# Patient Record
Sex: Female | Born: 2000 | Race: Black or African American | Hispanic: No | Marital: Single | Smoking: Never smoker
Health system: Southern US, Community
[De-identification: ages and names within clinical notes are randomized; demographics above are authoritative.]

## PROBLEM LIST (undated history)

## (undated) DIAGNOSIS — D649 Anemia, unspecified: Secondary | ICD-10-CM

---
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
## 2020-06-16 ENCOUNTER — Emergency Department (HOSPITAL_COMMUNITY): Payer: Medicaid - Out of State

## 2020-06-16 ENCOUNTER — Other Ambulatory Visit: Payer: Self-pay

## 2020-06-16 ENCOUNTER — Encounter (HOSPITAL_COMMUNITY): Payer: Self-pay | Admitting: *Deleted

## 2020-06-16 ENCOUNTER — Emergency Department (HOSPITAL_COMMUNITY)
Admission: EM | Admit: 2020-06-16 | Discharge: 2020-06-16 | Disposition: A | Discharge status: Home / Self Care | Payer: Medicaid - Out of State | Attending: Emergency Medicine | Admitting: Emergency Medicine

## 2020-06-16 DIAGNOSIS — S93412A Sprain of calcaneofibular ligament of left ankle, initial encounter: Secondary | ICD-10-CM

## 2020-06-16 DIAGNOSIS — S99912A Unspecified injury of left ankle, initial encounter: Secondary | ICD-10-CM | POA: Diagnosis present

## 2020-06-16 DIAGNOSIS — S93402A Sprain of unspecified ligament of left ankle, initial encounter: Secondary | ICD-10-CM | POA: Diagnosis not present

## 2020-06-16 DIAGNOSIS — X509XXA Other and unspecified overexertion or strenuous movements or postures, initial encounter: Secondary | ICD-10-CM | POA: Diagnosis not present

## 2020-06-16 DIAGNOSIS — Y9302 Activity, running: Secondary | ICD-10-CM | POA: Insufficient documentation

## 2020-06-16 HISTORY — DX: Anemia, unspecified: D64.9

## 2020-06-16 MED ORDER — IBUPROFEN 800 MG PO TABS
800.0000 mg | ORAL_TABLET | Freq: Once | ORAL | Status: AC
  Administered 2020-06-16: 800 mg via ORAL
  Filled 2020-06-16: qty 1

## 2020-06-16 NOTE — Discharge Instructions (Addendum)
You were evaluated today for your ankle injury.  Your vital signs and physical exam are very reassuring.  Your x-ray was negative for acute fracture or dislocation. I suspect your symptoms are secondary to ankle sprain, which is injury of the ligaments of the joint.  Below is the contact information for Dr. Dion Saucier, orthopedic physician- you may follow-up with him for reevaluation in 1 to 2 weeks.   Recommend you rest the joint, ice it for 15 to 20 minutes at a time daily for the next several days, and elevate the joint whenever you are at rest.   May utilize Tylenol or ibuprofen as needed at home for your discomfort.  Return to the emergency department if you develop any numbness, tingling, weakness in your leg or foot, or if you develop any other new severe symptoms.

## 2020-06-16 NOTE — ED Triage Notes (Signed)
Per EMS, pt was running and tripped. She hurt her left ankle, lateral aspect. Swelling pressent. Tender. No deformity. Good PMS.

## 2020-06-16 NOTE — ED Provider Notes (Signed)
[facility] Provider Note   CSN: [id number] Arrival date & time: 06/16/20  1514     History Chief Complaint  Patient presents with  . Leg Pain    Villa Burgin is a 20 y.o. female who presents with concern for left ankle pain after she twisted her ankle while running this evening.  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 She reports eversion of her ankle when she lost her footing while running.  She states she had immediate left ankle pain with swelling after the injury. Has been unable to ambulate on it since the incident.  She denies any numbness, tingling, weakness in her lower extremity.  Has never injured this ankle before.  Personally reviewed the patient's medical records.  She is history of anemia, but does not carry any other diagnoses and is not on any medications every day.  She is here for school from [place].  HPI     Past Medical History:  Diagnosis Date  . Anemia     There are no problems to display for this patient.   History reviewed. No pertinent surgical history.   OB History   No obstetric history on file.     No family history on file.  Social History   Tobacco Use  . Smoking status: Never Smoker  . Smokeless tobacco: Never Used  Substance Use Topics  . Alcohol use: Not Currently    Home Medications Prior to Admission medications   Not on File    Allergies    Patient has no allergy information on record.  Review of Systems   Review of Systems  Constitutional: Negative.   HENT: Negative.   Respiratory: Negative.   Cardiovascular: Negative.   Gastrointestinal: Negative.   Musculoskeletal: Positive for joint swelling.       Left ankle  Skin: Negative.   Neurological: Negative.   Hematological: Negative.     Physical Exam Updated Vital Signs BP 115/72 (BP Location: Left Arm)   Pulse 62   Temp 98.9 F (37.2 C) (Oral)   Resp 18   LMP 06/16/2020   SpO2 100%   Physical Exam Vitals and nursing note  reviewed.  HENT:     Head: Normocephalic and atraumatic.     Mouth/Throat:     Mouth: Mucous membranes are moist.     Pharynx: No oropharyngeal exudate or posterior oropharyngeal erythema.  Eyes:     General:        Right eye: No discharge.        Left eye: No discharge.     Conjunctiva/sclera: Conjunctivae normal.     Pupils: Pupils are equal, round, and reactive to light.  Cardiovascular:     Rate and Rhythm: Normal rate and regular rhythm.     Pulses: Normal pulses.     Heart sounds: Normal heart sounds.  Pulmonary:     Effort: Pulmonary effort is normal. No respiratory distress.     Breath sounds: Normal breath sounds. No wheezing or rales.  Abdominal:     General: Bowel sounds are normal. There is no distension.     Palpations: Abdomen is soft.     Tenderness: There is no abdominal tenderness.  Musculoskeletal:        General: Signs of injury present. No deformity.     Cervical back: Neck supple.     Right hip: Normal.     Left hip: Normal.     Right upper leg: Normal.     Left upper  leg: Normal.     Right knee: Normal.     Left knee: Normal.     Right lower leg: Normal. No edema.     Left lower leg: Normal. No edema.     Right ankle: Normal.     Right Achilles Tendon: Normal.     Left ankle: Swelling present. No ecchymosis or lacerations. Tenderness present over the lateral malleolus, ATF ligament and CF ligament. Anterior drawer test negative. Normal pulse.     Left Achilles Tendon: Normal.     Right foot: Normal. Normal capillary refill. Normal pulse.     Left foot: Normal. Normal capillary refill. Normal pulse.       Legs:     Comments: Decreased ROM in left ankle due to swelling and pain.  Skin:    General: Skin is warm and dry.     Capillary Refill: Capillary refill takes less than 2 seconds.  Neurological:     General: No focal deficit present.     Mental Status: She is alert and oriented to person, place, and time.     Sensory: Sensation is intact.      Motor: Motor function is intact.  Psychiatric:        Mood and Affect: Mood normal.     ED Results / Procedures / Treatments   Labs (all labs ordered are listed, but only abnormal results are displayed) Labs Reviewed - No data to display  EKG None  Radiology DG Ankle Complete Left  Result Date: 06/16/2020 CLINICAL DATA:  Left ankle pain after injury today. EXAM: LEFT ANKLE COMPLETE - 3+ VIEW COMPARISON:  None. FINDINGS: There is no evidence of fracture, dislocation, or joint effusion. There is no evidence of arthropathy or other focal bone abnormality. Soft tissue swelling is noted over the lateral malleolus. IMPRESSION: No fracture or dislocation is noted. Soft tissue swelling is noted over lateral malleolus. Electronically Signed   By: Lupita Raider M.D.   On: 06/16/2020 16:18    Procedures Procedures (including critical care time)  Medications Ordered in ED Medications  ibuprofen (ADVIL) tablet 800 mg (has no administration in time range)    ED Course  I have reviewed the triage vital signs and the nursing notes.  Pertinent labs & imaging results that were available during my care of the patient were reviewed by me and considered in my medical decision making (see chart for details).    MDM Rules/Calculators/A&P                         20 year old female presents with concern for left ankle injury after twisting it while running this evening.   Vital signs are normal and intact.  Physical exam significant for left ankle swelling over the lateral malleolus with associated tenderness to palpation over the lateral malleolus, anterior tibiofibular ligament, calcaneofibular ligament. Patient is neurovascularly intact in BLE.   Plain film of the left ankle obtained in triage with soft tissue swelling over the lateral malleolus, without fracture or dislocation. Suspect ankle sprain at this time.  Ibuprofen offered, will apply Ace wrap and give crutches.  Will provide contact  information for orthopedist for follow-up, given the patient is an athlete and very concerned about recovery of her ankle.  Given reassuring physical exam and imaging studies, no further work-up is warranted in the emergency department at this time.  Recommend RICE therapy, anti-inflammatories, and orthopedic follow-up.  Saleha voiced understanding of her medical evaluation and  treatment plan.  Each of her questions were answered to her expressed satisfaction.  Return precautions given.  Patient is well-appearing, stable, appropriate for discharge at this time.  This chart was dictated using voice recognition software, Dragon. Despite the best efforts of this provider to proofread and correct errors, errors may still occur which can change documentation meaning.  Final Clinical Impression(s) / ED Diagnoses Final diagnoses:  None    Rx / DC Orders ED Discharge Orders    None       Sherrilee Gilles 06/16/20 1738    Linwood Dibbles, MD 06/17/20 [phone]

## 2020-06-16 NOTE — Progress Notes (Signed)
Orthopedic Tech Progress Note Patient Details:  Sandra Edwards [birth year] [id number]  Ortho Devices Type of Ortho Device: Ace wrap Ortho Device/Splint Location: applied ace wrap and crutches Ortho Device/Splint Interventions: Ordered,Application,Adjustment   Post Interventions Patient Tolerated: Well Instructions Provided: Care of device   Jennye Moccasin 06/16/2020, 5:44 PM

## 2021-10-04 ENCOUNTER — Other Ambulatory Visit: Payer: Self-pay

## 2021-10-04 ENCOUNTER — Emergency Department (HOSPITAL_BASED_OUTPATIENT_CLINIC_OR_DEPARTMENT_OTHER)
Admission: EM | Admit: 2021-10-04 | Discharge: 2021-10-04 | Disposition: A | Discharge status: Home / Self Care | Payer: Medicaid Other | Attending: Emergency Medicine | Admitting: Emergency Medicine

## 2021-10-04 ENCOUNTER — Encounter (HOSPITAL_BASED_OUTPATIENT_CLINIC_OR_DEPARTMENT_OTHER): Payer: Self-pay | Admitting: Emergency Medicine

## 2021-10-04 ENCOUNTER — Ambulatory Visit
Admission: EM | Admit: 2021-10-04 | Discharge: 2021-10-04 | Disposition: A | Discharge status: Home / Self Care | Payer: Medicaid - Out of State

## 2021-10-04 DIAGNOSIS — K089 Disorder of teeth and supporting structures, unspecified: Secondary | ICD-10-CM | POA: Diagnosis not present

## 2021-10-04 DIAGNOSIS — R6884 Jaw pain: Secondary | ICD-10-CM | POA: Diagnosis present
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
## 2021-10-04 NOTE — Discharge Instructions (Signed)
As we discussed, your work-up in the ER today is reassuring for acute abnormalities.  I have an extremely low suspicion that you have a fracture in your face and therefore have advised against getting imaging at this time.  I recommend rest and ice with Tylenol/ibuprofen as needed for pain. ? ?Return if development of any new or worsening symptoms. ?
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
## 2021-10-04 NOTE — ED Notes (Signed)
Patient is being discharged from the Urgent Care and sent to the Emergency Department via POA. Per L. Morgan-Scales PA-C , patient is in need of higher level of care due to need for further evaluation. Patient is aware and verbalizes understanding of plan of care.  ?Vitals:  ? 10/04/21 1013  ?BP: 124/76  ?Pulse: 60  ?Resp: 18  ?Temp: 98 ?F (36.7 ?C)  ?SpO2: 98%  ?  ?
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
## 2022-02-01 IMAGING — CR DG ANKLE COMPLETE 3+V*L*
3 series · 3 of 3 positions shown · non-contrast
Comparison: None.

CLINICAL DATA: Left ankle pain after injury today.

EXAM:
LEFT ANKLE COMPLETE - 3+ VIEW

[x ankle ap left]
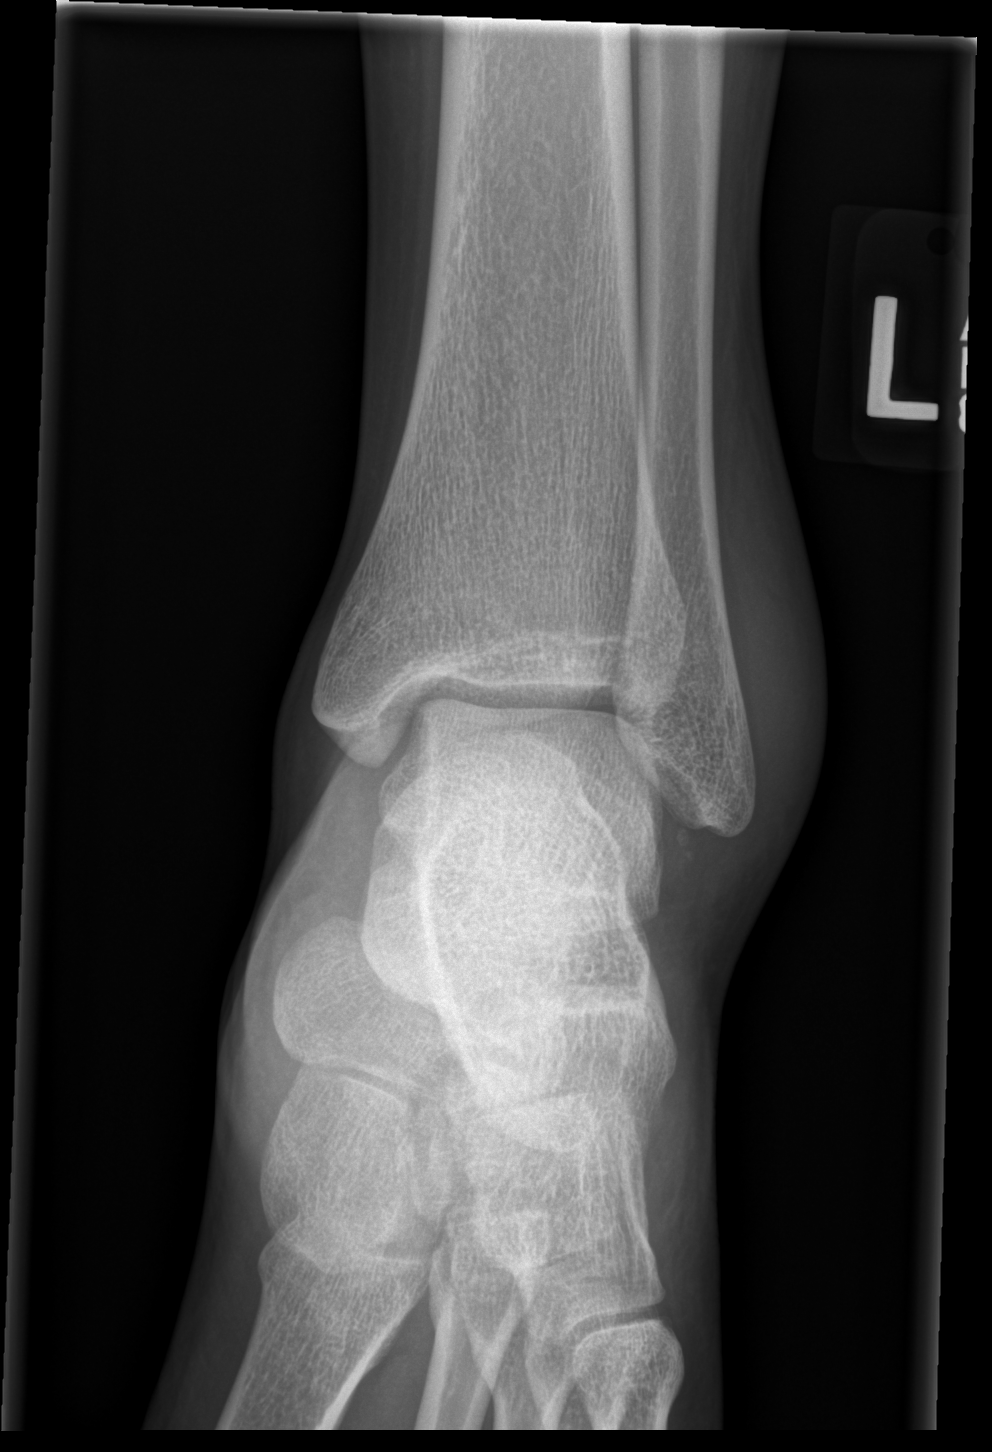

[x ankle obl left]
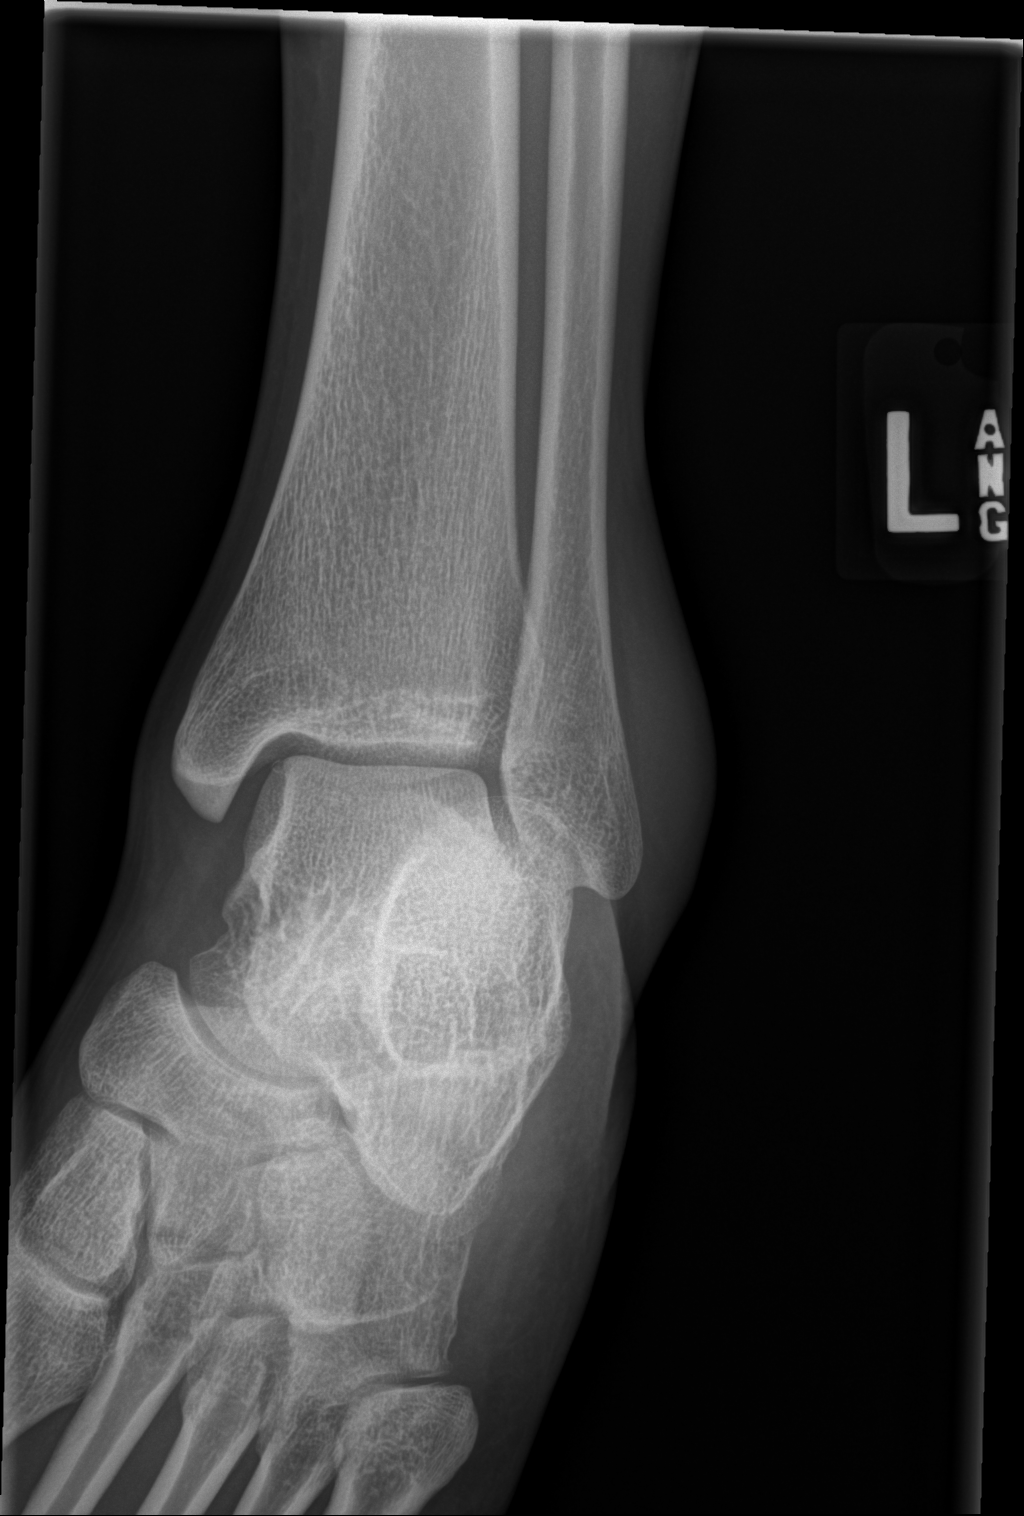

[x ankle lat left]
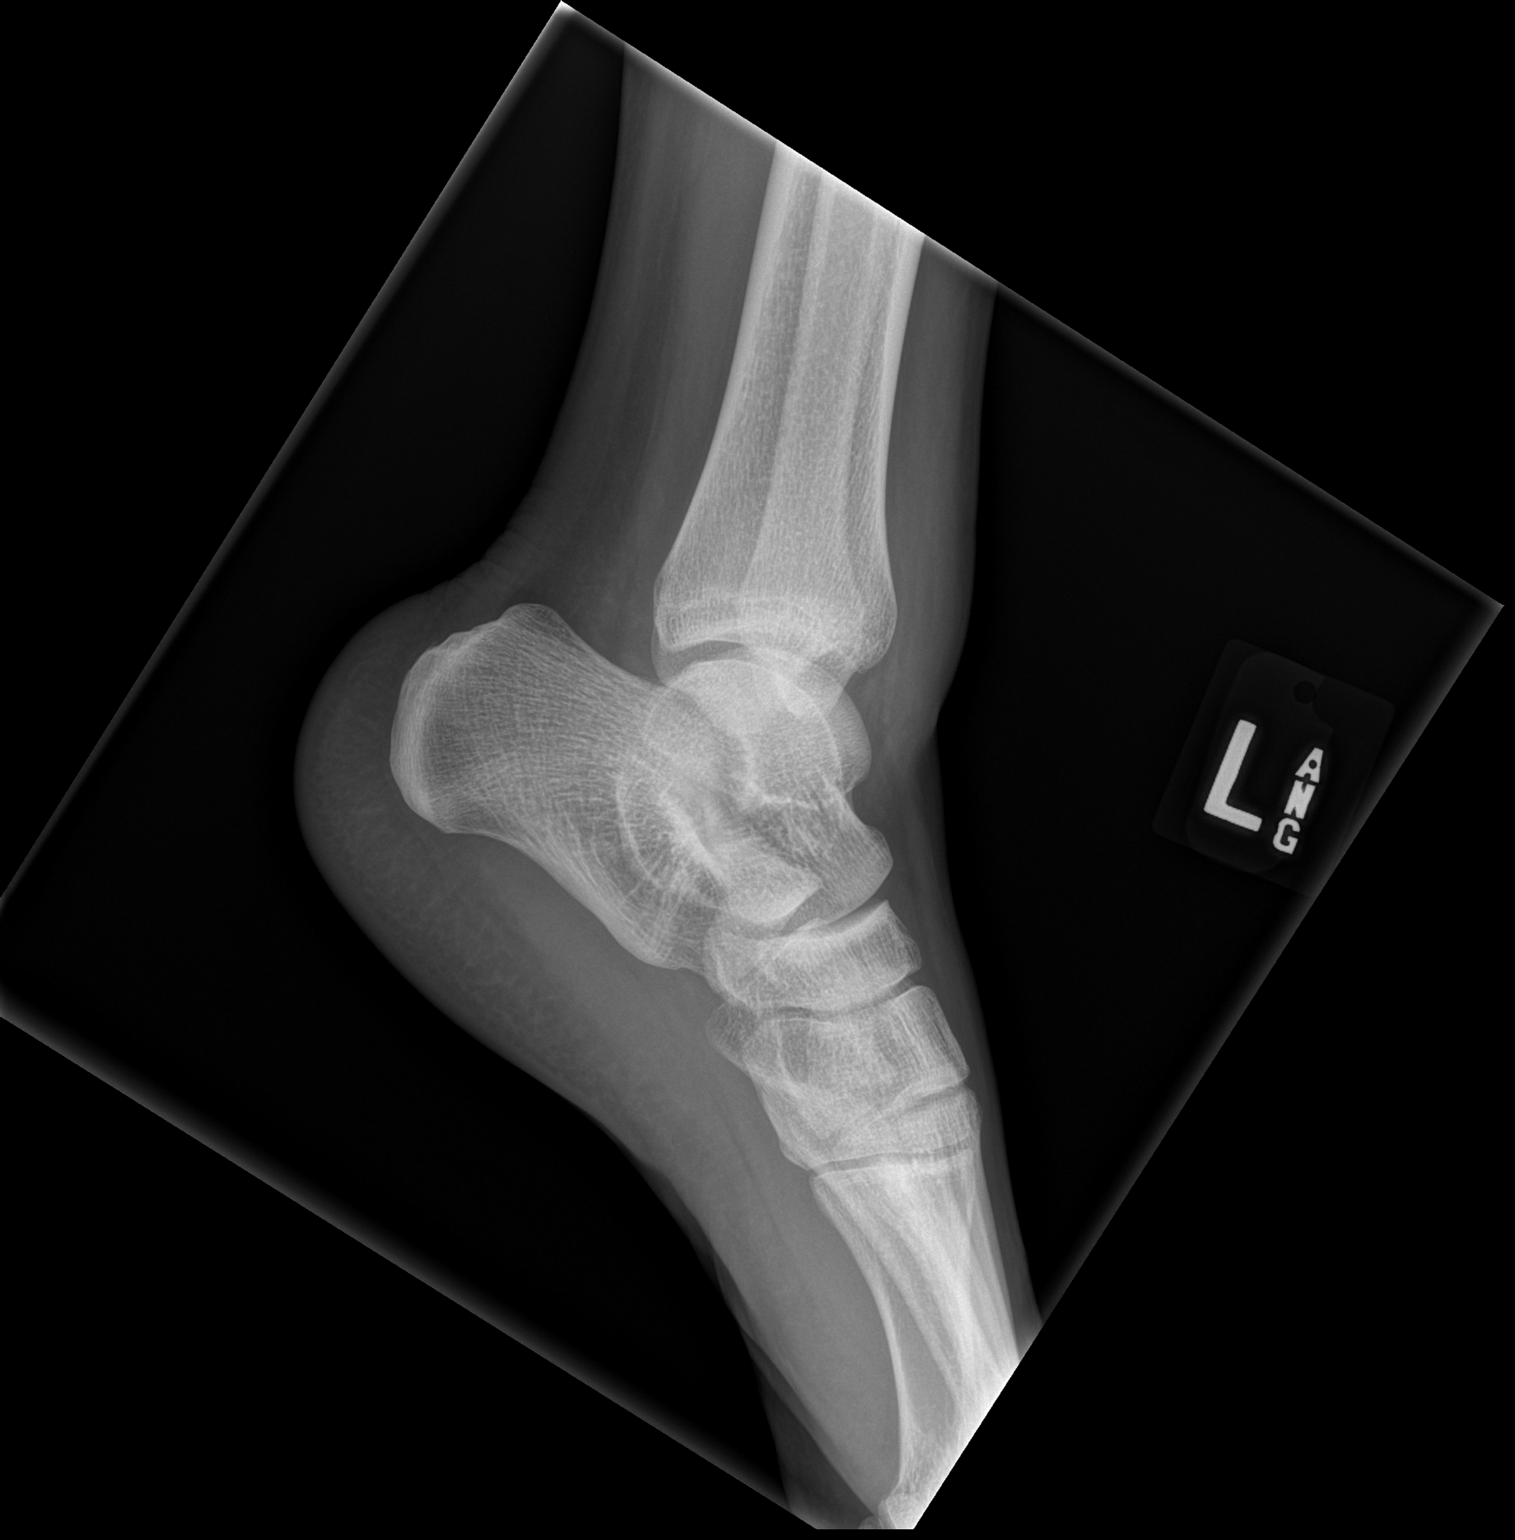

[3 of 3 positions shown; findings below may reference images not displayed]

FINDINGS: There is no evidence of fracture, dislocation, or joint effusion.
There is no evidence of arthropathy or other focal bone abnormality.
Soft tissue swelling is noted over the lateral malleolus.
IMPRESSION: No fracture or dislocation is noted. Soft tissue swelling is noted
over lateral malleolus.

## 2023-01-02 ENCOUNTER — Encounter (INDEPENDENT_AMBULATORY_CARE_PROVIDER_SITE_OTHER): Payer: Self-pay

## 2023-01-02 ENCOUNTER — Ambulatory Visit (INDEPENDENT_AMBULATORY_CARE_PROVIDER_SITE_OTHER): Payer: 59 | Admitting: Family Nurse Practitioner

## 2023-01-02 VITALS — BP 105/67 | HR 62 | Temp 97.9°F | Resp 17 | Ht 69.0 in | Wt 150.0 lb

## 2023-01-02 DIAGNOSIS — Z711 Person with feared health complaint in whom no diagnosis is made: Secondary | ICD-10-CM

## 2023-01-02 DIAGNOSIS — N926 Irregular menstruation, unspecified: Secondary | ICD-10-CM

## 2023-01-02 LAB — POCT PREGNANCY TEST, URINE HCG: POCT Pregnancy HCG Test, UR: NEGATIVE

## 2023-01-02 NOTE — Progress Notes (Unsigned)
Urgent Care  Note    Patient: Jade Rogers   Date: 01/03/2023   MRN: [id number]       Subjective     Chief Complaint   Patient presents with    Menstrual Problem     Pt c/o late period wants to do preg test. No otc meds 4 days late        HPI:  HPI    Jade Rogers is a 22 y.o. female presents with amenorrhea.  Patient is 4 days late and would like to do pregnancy test.  Reports periods are usually regular.  Denies any abdominal pain, back pain, urinary symptoms, vaginal discharge  No known STD exposures but would like to complete STI panel  No previous STDs  No contraceptives or daily meds      Pertinent Past Medical, Surgical, Family and Social History were reviewed.    Current Medications[1]    Allergies[2]    Medications and Allergies reviewed.         Objective     Vitals:    01/02/23 1652   BP: 105/67   Pulse: 62   Resp: 17   Temp: 97.9 F (36.6 C)   SpO2: 99%       Physical Exam     General: no acute distress, well developed, well nourished.    Lung: no respiratory distress, clear to auscultation bilaterally, no rales, no wheeze, no rhonchi    Heart: regular rate and rhythm, no murmurs    Abdomen: soft, non-distended, nontender, normoactive bowel sounds, no rebound, no guarding    GU: Deferred    Neurologic: alert, oriented, speech clear    UCC COURSE  LABS  The following POCT tests were ordered, reviewed and discussed with the patient/family.     Results       Procedure Component Value Units Date/Time    Extra Tubes [[id number]] Collected: 01/02/23 1721    Specimen: Blood, Venous Updated: 01/03/23 1000    Narrative:      The following orders were created for panel order Extra Tubes.  Procedure                               Abnormality         Status                     ---------                               -----------         ------                     Tresa Res - EDTA Hold JYNW[[id number]]                         Final result                 Please view results for these tests on the individual orders.     Lavender - EDTA Hold Tube [[id number]] Collected: 01/02/23 1721    Specimen: Blood, Venous Updated: 01/03/23 1000     Extra Tube Hold for add-ons.    Hepatitis C Antibody, Total (Order) [[id number]] Collected: 01/02/23 1721    Specimen: Blood, Venous Updated: 01/03/23 0201    Narrative:      The following  orders were created for panel order Hepatitis C Antibody, Total (Order).  Procedure                               Abnormality         Status                     ---------                               -----------         ------                     Hepatitis C Antibody, Tot.Marland KitchenMarland Kitchen[[id number]]  Normal              Final result               Gold SST Hold JYNW[[id number]]                                Final result                 Please view results for these tests on the individual orders.    HIV-1/2, Antigen and Antibody with Reflex to Confirmation (Order) [[id number]] Collected: 01/02/23 1721    Specimen: Blood, Venous Updated: 01/03/23 0201    Narrative:      The following orders were created for panel order HIV-1/2, Antigen and Antibody with Reflex to Confirmation (Order).  Procedure                               Abnormality         Status                     ---------                               -----------         ------                     HIV 1/2 Ag/Ab 4th Generat.Marland KitchenMarland Kitchen[[id number]]  Normal              Final result               Lavender - EDTA Hold LKGM[[id number]]                         Final result               Gold SST Hold GUYQ[[id number]]                                                             Please view results for these tests on the individual orders.    Erin Sons Hold Tube [[id number]] Collected: 01/02/23 1721    Specimen: Blood, Venous Updated: 01/03/23 0201     Extra Tube Hold for add-ons.    Lavender - EDTA Hold Tube [id number] Collected:  01/02/23 1721    Specimen: Blood, Venous Updated: 01/03/23 0201     Extra Tube Hold for add-ons.    Syphilis Screen, IgG and IgM [[id number]]  (Normal) Collected: 01/02/23 1721     Specimen: Blood, Venous Updated: 01/03/23 0146     Syphilis Screen IgG and IgM Non-Reactive    HIV 1/2 Ag/Ab 4th Generation with Reflex (Component) [[id number]]  (Normal) Collected: 01/02/23 1721    Specimen: Blood, Venous Updated: 01/03/23 0145     HIV Ag/Ab 4th Generation Non-Reactive    Hepatitis B (HBV) Surface Antigen with Reflex to Confirmation [[id number]]  (Normal) Collected: 01/02/23 1721    Specimen: Blood, Venous Updated: 01/03/23 0144     Hepatitis B Surface Antigen Non-Reactive    Hepatitis C Antibody, Total (Component) [[id number]]  (Normal) Collected: 01/02/23 1721    Specimen: Blood, Venous Updated: 01/03/23 0144     Hepatitis C Antibody Non-Reactive    Chlamydia trachomatis, Neisseria gonorrhoeae, Mycoplasma genitalium and Trichomonas vaginalis [[id number]] Collected: 01/02/23 1721    Specimen: Swab from Vagina  Updated: 01/02/23 1721    POCT Pregnancy Test, Urine HCG [[id number]]  (Normal) Collected: 01/02/23 1720     Updated: 01/02/23 1721     POCT QC Pass     POCT Pregnancy HCG Test, UR Negative     Comment: Negative Value is Normal in Healthy Males or Healthy non-pregnant Females            There were no x-rays reviewed with this patient during the visit.    Current Inpatient Medications with Last Dose Taken[3]          PROCEDURES:  Procedures      Plan:  Patient well-appearing and VSS  Pregnancy test negative in clinic.  Advised to repeat pregnancy test and a few days if still no period.    STI panel completed. Patient opted for self.  Advised to start MyChart.  Will be contacted with any positive results  Advised to follow-up with gyne   Patient agrees with plan      Assessment         Jade Rogers was seen today for menstrual problem.    Diagnoses and all orders for this visit:    Missed period  -     POCT Pregnancy Test, Urine HCG; Future  -     POCT Pregnancy Test, Urine HCG  -     Extra Tubes; Future  -     Extra Tubes    Concern about STD in female without diagnosis  -     Hepatitis B (HBV) Surface  Antigen with Reflex to Confirmation; Future  -     Hepatitis C Antibody, Total (Order); Future  -     HIV-1/2, Antigen and Antibody with Reflex to Confirmation (Order); Future  -     Syphilis Screen, IgG and IgM; Future  -     Hepatitis B (HBV) Surface Antigen with Reflex to Confirmation  -     Hepatitis C Antibody, Total (Order)  -     HIV-1/2, Antigen and Antibody with Reflex to Confirmation (Order)  -     Syphilis Screen, IgG and IgM  -     Chlamydia trachomatis, Neisseria gonorrhoeae, Mycoplasma genitalium and Trichomonas vaginalis; Future  -     Chlamydia trachomatis, Neisseria gonorrhoeae, Mycoplasma genitalium and Trichomonas vaginalis  -     Extra Tubes; Future  -     Extra Tubes        Plan and follow-up  discussed with patient. See AVS for further documentation.         [1] No current outpatient medications on file.  [2] No Known Allergies  [3]   No current facility-administered medications for this visit.

## 2023-01-02 NOTE — Patient Instructions (Signed)
If still no period, do first morning home pregnancy test in a few days  Follow up with gyne or PCP   We will contact you with any positive results

## 2023-01-03 ENCOUNTER — Encounter (INDEPENDENT_AMBULATORY_CARE_PROVIDER_SITE_OTHER): Payer: Self-pay | Admitting: Family Nurse Practitioner

## 2023-01-03 LAB — LAB USE ONLY - LAVENDER - EDTA HOLD TUBE

## 2023-01-04 ENCOUNTER — Telehealth (INDEPENDENT_AMBULATORY_CARE_PROVIDER_SITE_OTHER): Payer: Self-pay | Admitting: Family

## 2023-01-04 ENCOUNTER — Ambulatory Visit (INDEPENDENT_AMBULATORY_CARE_PROVIDER_SITE_OTHER): Payer: 59 | Admitting: Family Nurse Practitioner

## 2023-01-04 ENCOUNTER — Telehealth (INDEPENDENT_AMBULATORY_CARE_PROVIDER_SITE_OTHER): Payer: Self-pay | Admitting: Family Nurse Practitioner

## 2023-01-04 ENCOUNTER — Encounter (INDEPENDENT_AMBULATORY_CARE_PROVIDER_SITE_OTHER): Payer: Self-pay | Admitting: Family Nurse Practitioner

## 2023-01-04 DIAGNOSIS — A549 Gonococcal infection, unspecified: Secondary | ICD-10-CM

## 2023-01-04 DIAGNOSIS — N912 Amenorrhea, unspecified: Secondary | ICD-10-CM

## 2023-01-04 DIAGNOSIS — Z3202 Encounter for pregnancy test, result negative: Secondary | ICD-10-CM

## 2023-01-04 DIAGNOSIS — A749 Chlamydial infection, unspecified: Secondary | ICD-10-CM

## 2023-01-04 LAB — POCT PREGNANCY TEST, URINE HCG: POCT Pregnancy HCG Test, UR: NEGATIVE

## 2023-01-04 MED ORDER — AZITHROMYCIN 250 MG PO TABS
1000.0000 mg | ORAL_TABLET | Freq: Once | ORAL | 0 refills | Status: AC
Start: 2023-01-04 — End: 2023-01-04

## 2023-01-04 MED ORDER — CEFTRIAXONE SODIUM 500 MG IJ SOLR
500.0000 mg | Freq: Once | INTRAMUSCULAR | Status: AC
Start: 2023-01-04 — End: 2023-01-04
  Administered 2023-01-04: 500 mg via INTRAMUSCULAR

## 2023-01-04 NOTE — Telephone Encounter (Addendum)
Patient notified of positive gonorrhea and chlamydia.  Still no period, no new symptoms.  Advised to return to clinic for repeat pregnancy and Rocephin. Referral gynecologist.  Patient agrees with plan.

## 2023-01-04 NOTE — Addendum Note (Signed)
Addended by: Salem Senate B on: 01/04/2023 12:14 PM     Modules accepted: Orders

## 2023-01-04 NOTE — Progress Notes (Signed)
Rocephin IM injection only for gonorrhea, no adverse reaction.  Repeat pregnancy test negative.  Azithromycin for chlamydia sent to be cautious.  Repeat pregnancy test in a few days.

## 2023-01-04 NOTE — Telephone Encounter (Signed)
In error. Handled by on-site provider.

## 2023-09-09 ENCOUNTER — Encounter (HOSPITAL_COMMUNITY): Payer: Self-pay | Admitting: *Deleted

## 2023-09-09 ENCOUNTER — Emergency Department (HOSPITAL_COMMUNITY)
Admission: EM | Admit: 2023-09-09 | Discharge: 2023-09-09 | Discharge status: Against Medical Advice | Attending: Emergency Medicine | Admitting: Emergency Medicine

## 2023-09-09 ENCOUNTER — Other Ambulatory Visit: Payer: Self-pay

## 2023-09-09 DIAGNOSIS — L292 Pruritus vulvae: Secondary | ICD-10-CM | POA: Insufficient documentation

## 2023-09-09 DIAGNOSIS — Z5321 Procedure and treatment not carried out due to patient leaving prior to being seen by health care provider: Secondary | ICD-10-CM | POA: Diagnosis not present

## 2023-09-09 LAB — WET PREP, GENITAL
Clue Cells Wet Prep HPF POC: NONE SEEN
Sperm: NONE SEEN
Trich, Wet Prep: NONE SEEN
WBC, Wet Prep HPF POC: >=10 — AB (ref <10)
Yeast Wet Prep HPF POC: NONE SEEN

## 2023-09-09 LAB — URINALYSIS, ROUTINE W REFLEX MICROSCOPIC
Bilirubin Urine: NEGATIVE
Glucose, UA: NEGATIVE mg/dL
Hgb urine dipstick: NEGATIVE
Ketones, ur: NEGATIVE mg/dL
Nitrite: NEGATIVE
Protein, ur: NEGATIVE mg/dL
Specific Gravity, Urine: 1.019 (ref 1.005–1.030)
pH: 7 (ref 5.0–8.0)

## 2023-09-09 LAB — PREGNANCY, URINE: Preg Test, Ur: NEGATIVE

## 2023-09-09 NOTE — ED Provider Triage Note (Signed)
 Emergency Medicine Provider Triage Evaluation Note  Sandra Edwards , a 23 y.o. female  was evaluated in triage.  Pt complains of vaginal itching. She reports this has been ongoing for about 2-3 days. She is sexually active with one partner but reports condom use during these encounters. She states she is having a white chunky discharge with no appreciable odor. Denies dysuria, hematuria, or increased urinary frequency or urgency.  Review of Systems  Positive: As above Negative: As above  Physical Exam  LMP 08/26/2023 (Approximate)  Gen:   Awake, no distress   Resp:  Normal effort  MSK:   Moves extremities without difficulty  Other:  No abdominal tenderness  Medical Decision Making  Medically screening exam initiated at 6:04 PM.  Appropriate orders placed.  Krystiana Fornes was informed that the remainder of the evaluation will be completed by another provider, this initial triage assessment does not replace that evaluation, and the importance of remaining in the ED until their evaluation is complete.     Smitty Knudsen, PA-C 09/09/23 1806

## 2023-09-09 NOTE — ED Triage Notes (Signed)
 Pt is having vaginal itching and irritation as well as chunky white and chuncky discharge.

## 2023-09-09 NOTE — ED Notes (Addendum)
 Pt stated she was leaving the ED. Pt seen leaving the ED. Pt throwing things at staff.

## 2023-09-09 NOTE — ED Notes (Signed)
 Pt given swabs and instructions for self swab

## 2023-09-10 LAB — GC/CHLAMYDIA PROBE AMP (~~LOC~~) NOT AT ARMC
Chlamydia: POSITIVE — AB
Comment: NEGATIVE
Comment: NORMAL
Neisseria Gonorrhea: NEGATIVE

## 2023-09-11 ENCOUNTER — Telehealth (HOSPITAL_COMMUNITY): Payer: Self-pay

## 2023-09-11 MED ORDER — DOXYCYCLINE HYCLATE 100 MG PO CAPS: 100.0000 mg | ORAL_CAPSULE | Freq: Two times a day (BID) | ORAL | 0 refills | Status: AC

## 2023-10-28 ENCOUNTER — Encounter (INDEPENDENT_AMBULATORY_CARE_PROVIDER_SITE_OTHER): Payer: Self-pay

## 2023-10-28 ENCOUNTER — Ambulatory Visit (INDEPENDENT_AMBULATORY_CARE_PROVIDER_SITE_OTHER): Admitting: Family Nurse Practitioner

## 2023-10-28 VITALS — BP 141/88 | HR 66 | Temp 97.8°F | Resp 18 | Ht 70.0 in | Wt 166.8 lb

## 2023-10-28 DIAGNOSIS — B9789 Other viral agents as the cause of diseases classified elsewhere: Secondary | ICD-10-CM

## 2023-10-28 DIAGNOSIS — J329 Chronic sinusitis, unspecified: Secondary | ICD-10-CM

## 2023-10-28 LAB — POC COVID QUICKVUE ANTIGEN: QuickVue SARS COV2 Antigen POCT: NEGATIVE

## 2023-10-28 NOTE — Progress Notes (Signed)
 Urgent Care Provider Note    Patient: Jade Rogers   Date: 10/28/2023   MRN: [id number]       Subjective     Chief Complaint   Patient presents with    Otalgia     Pt C/O nose, ear headaches,  left eye pain. Onset this morning.       HPI:  HPI    Jade Rogers is a 23 y.o. female presents with URI sx that started this AM.  Reports left side nose and sinus pressure that is radiating to ear.  Starting to feel as if she is coming down with something, as mild body aches are starting. No recent COVID, Influenza, or antibiotics.  Took vitamin C   Denies fever, sore throat, cough    Pertinent Past Medical, Surgical, Family and Social History were reviewed.    Current Medications[1]    Allergies[2]    Medications and Allergies reviewed.         Objective     Vitals:    10/28/23 1343   BP: 141/88   Pulse: 66   Resp: 18   Temp: 97.8 F (36.6 C)   SpO2: 99%       Physical Exam     General: no acute distress, well developed, well nourished.    Eyes: no conjunctival injection or discharge    Ear: TMs clear bilaterally, normal light reflexes, clear canals without cerumen impaction    Nose: mild edema and tenderness to left maxilla     Throat: moist mucous membranes, no erythema, no exudates, no tonsillar swelling, no drooling, no PTA    Neck: supple, no cervical adenopathy    Lung: no respiratory distress, clear to auscultation bilaterally, no rales, no wheeze, no rhonchi    Heart: regular rate and rhythm, no murmurs    Neurologic: alert, oriented, speech clear    UCC COURSE  LABS  The following POCT tests were ordered, reviewed and discussed with the patient/family.     Results for orders placed or performed in visit on 10/28/23 (from the past 24 hours)   [employer] SARS-COV-2 Antigen POCT    Collection Time: 10/28/23  2:18 PM   Result Value    Quick[employer]ue SARS COV2 Antigen POCT Negative       There were no x-rays reviewed with this patient during the visit.    Current Inpatient Medications with Last Dose Taken[3]           PROCEDURES:  Procedures        MDM:  Ddx:  URI, COVID, Influenza, Strep, Uvulitis, PTA, Viral sinusitis, Bronchitis, Pneumonia   Patient well-appearing and VSS  COVID negative.  Discussed potential for false negative results    Low suspicion for bacterial process at this time   Supportive care and follow up discussed.  Start decongestant and Flonase.   Patient agrees with plan       Assessment         Jade Rogers was seen today for otalgia.    Diagnoses and all orders for this visit:    Viral sinusitis  -     QuickVue SARS-COV-2 Antigen POCT; Future  -     QuickVue SARS-COV-2 Antigen POCT        Plan and follow-up discussed with patient. See AVS for further documentation.         [1] No current outpatient medications on file.  [2] No Known Allergies  [3]   No current facility-administered medications for this  visit.

## 2023-10-28 NOTE — Patient Instructions (Signed)
 Take a decongestant   Flonase 1-2 sprays in each nostril for at least 1-2 weeks  Tylenol  or Motrin as needed   Follow up if symptoms are persistent or worsening after 7-10 days

## 2023-12-15 ENCOUNTER — Ambulatory Visit (INDEPENDENT_AMBULATORY_CARE_PROVIDER_SITE_OTHER): Admitting: Family Nurse Practitioner

## 2023-12-15 ENCOUNTER — Encounter (INDEPENDENT_AMBULATORY_CARE_PROVIDER_SITE_OTHER): Payer: Self-pay | Admitting: Family Nurse Practitioner

## 2023-12-15 VITALS — BP 100/65 | HR 68 | Temp 98.0°F | Resp 19 | Ht 70.0 in | Wt 161.0 lb

## 2023-12-15 DIAGNOSIS — Z111 Encounter for screening for respiratory tuberculosis: Secondary | ICD-10-CM

## 2023-12-15 NOTE — Progress Notes (Signed)
 [facility] TB NOTE:    Tuberculosis (TB) Screening and Risk Assessment completed today: Yes     Screening for Active disease/symptoms of Tuberculosis infection: Yes               Testing: Is not indicated based on screening assessment.

## 2024-01-12 ENCOUNTER — Ambulatory Visit (INDEPENDENT_AMBULATORY_CARE_PROVIDER_SITE_OTHER): Admitting: Physician Assistant

## 2024-01-12 ENCOUNTER — Encounter (INDEPENDENT_AMBULATORY_CARE_PROVIDER_SITE_OTHER): Payer: Self-pay | Admitting: Physician Assistant

## 2024-01-12 VITALS — BP 115/67 | HR 81 | Temp 99.2°F | Resp 20 | Ht 70.0 in | Wt 161.0 lb

## 2024-01-12 DIAGNOSIS — Z202 Contact with and (suspected) exposure to infections with a predominantly sexual mode of transmission: Secondary | ICD-10-CM

## 2024-01-12 DIAGNOSIS — Z3202 Encounter for pregnancy test, result negative: Secondary | ICD-10-CM

## 2024-01-12 DIAGNOSIS — R8290 Unspecified abnormal findings in urine: Secondary | ICD-10-CM

## 2024-01-12 DIAGNOSIS — N898 Other specified noninflammatory disorders of vagina: Secondary | ICD-10-CM

## 2024-01-12 LAB — MCKESSON POCT UA 120
Glucose UA: NEGATIVE
Ketone UA: NEGATIVE
Nitrite UA: NEGATIVE
Protein UA: NEGATIVE
Specific Gravity UA: 1.025
Urobilinogen UA: NEGATIVE
pH UA: 6

## 2024-01-12 LAB — POCT PREGNANCY TEST, URINE HCG: POCT Pregnancy HCG Test, UR: NEGATIVE

## 2024-01-12 NOTE — Progress Notes (Signed)
 Patient: Jade Rogers   Date: 01/12/2024   MRN: [id number]           Subjective     Chief Complaint   Patient presents with    Vaginal Discharge     Pt c/o with a bad odor and vaginal discharge.         HPI   ARIANNY PUN is a 23 y.o. who complains of vaginal discharge that began 6 days ago.   Type of sexual contact: vaginal  Risk factors: history of STIs and multiple sexual partners  History of IVDU: no  Has not attempted any alleviating measures prior to visit..  Patient's last menstrual period was 12/15/2023 (approximate).      History:  Pertinent Past Medical, Surgical, Family and Social History were reviewed.  Current Medications[1]  Allergies[2]  Medications and Allergies reviewed.         Objective   Vitals:    01/12/24 1335   BP: 115/67   BP Site: Right arm   Patient Position: Sitting   Cuff Size: Medium   Pulse: 81   Resp: 20   Temp: 99.2 F (37.3 C)   TempSrc: Tympanic   SpO2: 98%   Weight: 73 kg (161 lb)   Height: 1.778 m (5' 10)     Body mass index is 23.1 kg/m.    Physical Exam  Vitals reviewed.   Constitutional:       Appearance: Normal appearance.   HENT:      Head: Normocephalic.   Eyes:      Conjunctiva/sclera: Conjunctivae normal.   Cardiovascular:      Rate and Rhythm: Normal rate and regular rhythm.   Pulmonary:      Effort: Pulmonary effort is normal.   Abdominal:      General: Abdomen is flat. There is no distension.      Palpations: Abdomen is soft.      Tenderness: There is no abdominal tenderness. There is no right CVA tenderness or left CVA tenderness.   Neurological:      Mental Status: She is alert and oriented to person, place, and time.   Psychiatric:         Mood and Affect: Mood normal.         Behavior: Behavior normal.              UCC Course   LABS  The following POCT tests were ordered, reviewed and discussed with the patient/family.     Results for orders placed or performed in visit on 01/12/24 (from the past 24 hours)   [employer] POCT UA    Collection Time: 01/12/24  1:48 PM    Result Value    Color, UA Amber    Clarity, UA Clear    Leukocytes UA Trace (15 Leu/ul) (A)    Nitrite UA Negative    Urobilinogen UA Negative (0.2 mg/dl)    Protein UA Negative    pH UA 6.0    Blood UA 3+ (200 Ery/ul) (A)    Specific Gravity UA 1.025    Ketone UA Negative    Bilirubin UA 1+ (1mg /dl) (A)    Glucose UA Negative   POCT Pregnancy Test, Urine HCG    Collection Time: 01/12/24  1:50 PM   Result Value    POCT QC Pass    POCT Pregnancy HCG Test, UR Negative    Comment:      Negative Value is Normal in Healthy Males or Healthy non-pregnant  Females     There were no x-rays reviewed with this patient during the visit.  Current Inpatient Medications with Last Dose Taken[3]       Procedures   Procedures      MDM/Assessment    23 y/o F with history of STI presents for STI screening after developing foul-smelling vaginal discharge following episode of unprotected sex two weeks ago. She denies fevers and chills. Pregnancy test negative here. Plan to send STI workup    Differential Diagnosis including but not limited to: Gonorrhea, chlamydia, bacterial vaginosis, trichomonas   Encounter Diagnoses   Name Primary?    Abnormal urine odor Yes    Possible exposure to STI     Vaginal discharge           Plan  - Patient declined empiric therapy for gonorrhea and chlamydia. She was informed that we will reach out once results are available, and medication would be prescribed if appropriate.  Abstain from intercourse until test results return and encouraged to adhere to protected sex to limit STI exposure    Orders Placed This Encounter   Procedures    Chlamydia trachomatis, Neisseria gonorrhoeae, Mycoplasma genitalium and Trichomonas vaginalis    [employer] POCT UA    POCT Pregnancy Test, Urine HCG     Requested Prescriptions      No prescriptions requested or ordered in this encounter       Discussed results and diagnosis with patient/family.  Reviewed warning signs for worsening condition, as well as, indications for  follow-up with primary care physician and return to urgent care clinic.   Patient/family expressed understanding of instructions.     An After Visit Summary with pertinent information was made available to patient/family via MyChart or in-print.               [1]   Current Outpatient Medications:     Retin-A 0.025 % cream, , Disp: , Rfl:   [2] No Known Allergies  [3]   No current facility-administered medications for this visit.

## 2024-01-14 ENCOUNTER — Ambulatory Visit (INDEPENDENT_AMBULATORY_CARE_PROVIDER_SITE_OTHER): Payer: Self-pay

## 2024-01-14 LAB — CHLAMYDIA TRACHOMATIS, NEISSERIA GONORRHOEAE, MYCOPLASMA GENITALIUM AND TRICHOMONAS VAGINALIS
Chlamydia trachomatis DNA: NEGATIVE
Mycoplasma genitalium DNA: NEGATIVE
Neisseria gonorrhoeae DNA: NEGATIVE
Trichomonas vaginalis DNA: NEGATIVE

## 2024-03-16 ENCOUNTER — Ambulatory Visit (INDEPENDENT_AMBULATORY_CARE_PROVIDER_SITE_OTHER): Admitting: Family Nurse Practitioner

## 2024-03-16 DIAGNOSIS — Z111 Encounter for screening for respiratory tuberculosis: Secondary | ICD-10-CM

## 2024-03-16 NOTE — Progress Notes (Signed)
 [facility] TB NOTE:    Tuberculosis (TB) Screening and Risk Assessment completed today: Yes     Screening for Active disease/symptoms of Tuberculosis infection: Yes               Testing: Is not indicated based on screening assessment.

## 2024-03-16 NOTE — Progress Notes (Deleted)
 [facility] NOTE:    Tuberculosis (TB) Screening and Risk Assessment completed today: Yes     Screening for Active disease/symptoms of Tuberculosis infection: Yes     {If TB symptoms present, evaluate for active TB disease with [facility] POSITIVE TB  (Optional):[id number]}          Testing: Is indicated based on screening assessment: Testing today done by Tuberculin Skin Test (PPD)  Indication:  Age < 2 years    PPD placed today.  Completed TB screening form scanned under media and given to patient.   Patient to return for reading within 48-72 hours of administration.  If the test is not read within 48-72 hours of initial placement, the test is repeated in the other arm immediately.      Orders Placed This Encounter   Procedures    TB Skin Test     Standing Status:   Future     Number of Occurrences:   1     Expected Date:   03/16/2024     Expiration Date:   03/16/2025     Release to patient:   Immediate

## 2024-05-31 ENCOUNTER — Ambulatory Visit (INDEPENDENT_AMBULATORY_CARE_PROVIDER_SITE_OTHER): Admitting: Nurse Practitioner

## 2024-05-31 ENCOUNTER — Encounter (INDEPENDENT_AMBULATORY_CARE_PROVIDER_SITE_OTHER): Payer: Self-pay

## 2024-05-31 VITALS — BP 117/79 | HR 66 | Temp 97.6°F | Resp 18 | Ht 69.0 in | Wt 159.0 lb

## 2024-05-31 DIAGNOSIS — B379 Candidiasis, unspecified: Secondary | ICD-10-CM

## 2024-05-31 DIAGNOSIS — N930 Postcoital and contact bleeding: Secondary | ICD-10-CM

## 2024-05-31 DIAGNOSIS — N898 Other specified noninflammatory disorders of vagina: Secondary | ICD-10-CM

## 2024-05-31 MED ORDER — FLUCONAZOLE 150 MG PO TABS: 150.0000 mg | ORAL_TABLET | Freq: Once | ORAL | 0 refills | Status: AC

## 2024-05-31 NOTE — Progress Notes (Signed)
 [facility]  OFFICE NOTE         Subjective   Historian: Patient      Chief Complaint   Patient presents with    Vaginal Itching     Pt c/o vaginal itchiness and white chunky discharge for 5 days.   Reports bleeding during and after intercourse on 12/25.   LMP - 12/18.      HPI  Jade Rogers is a 23 y.o. female who presents for white discharge x 5 days;     History:  Medications and Allergies reviewed.   Pertinent Past Medical, Surgical, Family and Social History were reviewed.        Objective     Vitals:    05/31/24 0947   BP: 117/79   BP Site: Left arm   Patient Position: Sitting   Cuff Size: Medium   Pulse: 66   Resp: 18   Temp: 97.6 F (36.4 C)   TempSrc: Tympanic   SpO2: 99%   Weight: 72.1 kg (159 lb)   Height: 1.753 m (5' 9)      Body mass index is 23.48 kg/m.          Physical Exam  Constitutional:       General: She is not in acute distress.     Appearance: She is well-developed.   HENT:      Head: Normocephalic and atraumatic.     Eyes: Conjunctivae are normal. Cardiovascular:      Rate and Rhythm: Normal rate and regular rhythm.   Pulmonary:      Effort: Pulmonary effort is normal.      Breath sounds: Normal breath sounds.   Genitourinary:     Comments: White discharge curd like   Neurological:      Mental Status: She is alert and oriented to person, place, and time.   Skin:     General: Skin is warm.   Vitals and nursing note reviewed.     Urgent Care Course   There were no labs reviewed with this patient during the visit.    There were no x-rays reviewed with this patient during the visit.      Procedures   Procedures     Assessment / Plan     Differential Diagnoses including but not limited to: candida, bv,sti, vaginitis     Start Diflucan  for possible yeast infection details of discharge correlate does have a history of BV but will wait for results to treat abstain from intercourse at this time will call with lab results and treat accordingly ER precautions discussed    Jade Rogers was  seen today for vaginal itching.    Diagnoses and all orders for this visit:    Candida infection  -     fluconazole  (DIFLUCAN ) 150 MG tablet; Take 1 tablet (150 mg) by mouth once for 1 dose Take 1 tablet by mouth if no improvement take another tablet 72 hours later    Vaginal discharge  -     Vaginal Bacterial vaginosis, Candida spp., and Trichomonas vaginalis, PCR; Future  -     Chlamydia trachomatis, Neisseria gonorrhoeae, Mycoplasma genitalium and Trichomonas vaginalis; Future  -     Vaginal Bacterial vaginosis, Candida spp., and Trichomonas vaginalis, PCR  -     Chlamydia trachomatis, Neisseria gonorrhoeae, Mycoplasma genitalium and Trichomonas vaginalis    PCB (post coital bleeding)  -     Vaginal Bacterial vaginosis, Candida spp., and Trichomonas vaginalis, PCR; Future  -  Chlamydia trachomatis, Neisseria gonorrhoeae, Mycoplasma genitalium and Trichomonas vaginalis; Future  -     Vaginal Bacterial vaginosis, Candida spp., and Trichomonas vaginalis, PCR  -     Chlamydia trachomatis, Neisseria gonorrhoeae, Mycoplasma genitalium and Trichomonas vaginalis         The indications for early follow-up with PCP and return to UC were discussed. Patient/family received education on the working diagnosis, diagnostic uncertainties, and proposed treatment plan. Indications for emergency evaluation in the ED were reviewed. Written and verbal discharge instructions were provided and discussed and all questions from the patient/family were addressed, with no apparent barriers.

## 2024-06-02 ENCOUNTER — Ambulatory Visit (INDEPENDENT_AMBULATORY_CARE_PROVIDER_SITE_OTHER): Payer: Self-pay | Admitting: Medical

## 2024-06-02 DIAGNOSIS — N760 Acute vaginitis: Secondary | ICD-10-CM

## 2024-06-02 LAB — VAGINAL BACTERIAL VAGINOSIS, CANDIDA SPP., AND TRICHOMONAS VAGINALIS, PCR
Bacterial vaginosis marker DNA: DETECTED — AB
Candida glabrata DNA: NOT DETECTED
Candida group DNA: DETECTED — AB
Candida krusei DNA: NOT DETECTED
Trichomonas vaginalis DNA: NOT DETECTED

## 2024-06-03 LAB — CHLAMYDIA TRACHOMATIS, NEISSERIA GONORRHOEAE, MYCOPLASMA GENITALIUM AND TRICHOMONAS VAGINALIS
Chlamydia trachomatis DNA: NEGATIVE
Mycoplasma genitalium DNA: NEGATIVE
Neisseria gonorrhoeae DNA: NEGATIVE
Trichomonas vaginalis DNA: NEGATIVE

## 2024-06-04 ENCOUNTER — Encounter (INDEPENDENT_AMBULATORY_CARE_PROVIDER_SITE_OTHER): Payer: Self-pay | Admitting: Medical

## 2024-06-04 NOTE — Telephone Encounter (Signed)
 Attempt #1: Called patient to review results. LVM to return call to discuss. VM confirms patients first and last name. Patient with +candida already treated with fluconazole . +BV, recommend Falgyl BID x7 days or Metrogel nightly x5 days.

## 2024-06-05 NOTE — Telephone Encounter (Signed)
 Attempt #2: Called patient to review results. LVM to return call to discuss. VM confirms patients first and last name. Patient with +candida already treated with fluconazole . +BV, recommend Falgyl BID x7 days or Metrogel nightly x5 days.

## 2024-06-06 NOTE — Telephone Encounter (Signed)
 Attempt #3: Called patient to review results. LVM to return call to discuss. VM confirms patients first and last name. Patient with +candida already treated with fluconazole . +BV, recommend Falgyl BID x7 days or Metrogel nightly x5 days. MyChart message not read yet. Letter sent out.
